# Patient Record
Sex: Female | Born: 2000 | Race: White | Hispanic: No | Marital: Single | Smoking: Current some day smoker
Health system: Southern US, Community
[De-identification: ages and names within clinical notes are randomized; demographics above are authoritative.]

## PROBLEM LIST (undated history)

## (undated) DIAGNOSIS — F419 Anxiety disorder, unspecified: Secondary | ICD-10-CM

---
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
## 2018-09-07 ENCOUNTER — Encounter (HOSPITAL_COMMUNITY): Payer: Self-pay

## 2018-09-07 ENCOUNTER — Emergency Department (HOSPITAL_COMMUNITY)
Admission: EM | Admit: 2018-09-07 | Discharge: 2018-09-08 | Disposition: A | Discharge status: Other Acute Inpatient Hospital | Payer: Medicaid Other | Attending: Emergency Medicine | Admitting: Emergency Medicine

## 2018-09-07 ENCOUNTER — Other Ambulatory Visit: Payer: Self-pay

## 2018-09-07 DIAGNOSIS — Z1159 Encounter for screening for other viral diseases: Secondary | ICD-10-CM | POA: Insufficient documentation

## 2018-09-07 DIAGNOSIS — G4700 Insomnia, unspecified: Secondary | ICD-10-CM | POA: Insufficient documentation

## 2018-09-07 DIAGNOSIS — F19959 Other psychoactive substance use, unspecified with psychoactive substance-induced psychotic disorder, unspecified: Secondary | ICD-10-CM

## 2018-09-07 DIAGNOSIS — R000 Tachycardia, unspecified: Secondary | ICD-10-CM | POA: Insufficient documentation

## 2018-09-07 DIAGNOSIS — F17210 Nicotine dependence, cigarettes, uncomplicated: Secondary | ICD-10-CM | POA: Insufficient documentation

## 2018-09-07 DIAGNOSIS — F1990 Other psychoactive substance use, unspecified, uncomplicated: Secondary | ICD-10-CM | POA: Insufficient documentation

## 2018-09-07 DIAGNOSIS — F29 Unspecified psychosis not due to a substance or known physiological condition: Secondary | ICD-10-CM | POA: Insufficient documentation

## 2018-09-07 HISTORY — DX: Anxiety disorder, unspecified: F41.9

## 2018-09-07 LAB — CBC WITH DIFFERENTIAL/PLATELET
Abs Immature Granulocytes: 0.02 10*3/uL (ref 0.00–0.07)
Basophils Absolute: 0.1 10*3/uL (ref 0.0–0.1)
Basophils Relative: 1 %
Eosinophils Absolute: 0.1 10*3/uL (ref 0.0–0.5)
Eosinophils Relative: 1 %
HCT: 41.6 % (ref 36.0–46.0)
Hemoglobin: 13.9 g/dL (ref 12.0–15.0)
Immature Granulocytes: 0 %
Lymphocytes Relative: 22 %
Lymphs Abs: 2.5 10*3/uL (ref 0.7–4.0)
MCH: 28.7 pg (ref 26.0–34.0)
MCHC: 33.4 g/dL (ref 30.0–36.0)
MCV: 85.8 fL (ref 80.0–100.0)
Monocytes Absolute: 0.8 10*3/uL (ref 0.1–1.0)
Monocytes Relative: 7 %
Neutro Abs: 7.8 10*3/uL — ABNORMAL HIGH (ref 1.7–7.7)
Neutrophils Relative %: 69 %
Platelets: 275 10*3/uL (ref 150–400)
RBC: 4.85 MIL/uL (ref 3.87–5.11)
RDW: 12.8 % (ref 11.5–15.5)
WBC: 11.3 10*3/uL — ABNORMAL HIGH (ref 4.0–10.5)
nRBC: 0 % (ref 0.0–0.2)

## 2018-09-07 LAB — ETHANOL: Alcohol, Ethyl (B): <10 mg/dL (ref <10)

## 2018-09-07 LAB — COMPREHENSIVE METABOLIC PANEL
ALT: 14 U/L (ref 0–44)
AST: 17 U/L (ref 15–41)
Albumin: 4.5 g/dL (ref 3.5–5.0)
Alkaline Phosphatase: 58 U/L (ref 38–126)
Anion gap: 15 (ref 5–15)
BUN: <5 mg/dL — ABNORMAL LOW (ref 6–20)
CO2: 22 mmol/L (ref 22–32)
Calcium: 9.9 mg/dL (ref 8.9–10.3)
Chloride: 102 mmol/L (ref 98–111)
Creatinine, Ser: 0.7 mg/dL (ref 0.44–1.00)
GFR calc Af Amer: >60 mL/min (ref >60)
GFR calc non Af Amer: >60 mL/min (ref >60)
Glucose, Bld: 122 mg/dL — ABNORMAL HIGH (ref 70–99)
Potassium: 3.3 mmol/L — ABNORMAL LOW (ref 3.5–5.1)
Sodium: 139 mmol/L (ref 135–145)
Total Bilirubin: 0.8 mg/dL (ref 0.3–1.2)
Total Protein: 7.7 g/dL (ref 6.5–8.1)

## 2018-09-07 LAB — ACETAMINOPHEN LEVEL: Acetaminophen (Tylenol), Serum: <10 ug/mL — ABNORMAL LOW (ref 10–30)

## 2018-09-07 LAB — PREGNANCY, URINE: Preg Test, Ur: NEGATIVE

## 2018-09-07 LAB — RAPID URINE DRUG SCREEN, HOSP PERFORMED
Amphetamines: NOT DETECTED
Barbiturates: NOT DETECTED
Benzodiazepines: POSITIVE — AB
Cocaine: NOT DETECTED
Opiates: NOT DETECTED
Tetrahydrocannabinol: POSITIVE — AB

## 2018-09-07 LAB — TSH: TSH: 1.5 u[IU]/mL (ref 0.350–4.500)

## 2018-09-07 LAB — SALICYLATE LEVEL: Salicylate Lvl: <7 mg/dL (ref 2.8–30.0)

## 2018-09-07 MED ORDER — ONDANSETRON HCL 4 MG PO TABS: 4.0000 mg | ORAL_TABLET | Freq: Three times a day (TID) | ORAL | Status: DC | PRN

## 2018-09-07 MED ORDER — DIPHENHYDRAMINE HCL 25 MG PO CAPS
50.0000 mg | ORAL_CAPSULE | Freq: Once | ORAL | Status: AC
  Administered 2018-09-07: 14:00:00 50 mg via ORAL
  Filled 2018-09-07: qty 2

## 2018-09-07 MED ORDER — ALUM & MAG HYDROXIDE-SIMETH 200-200-20 MG/5ML PO SUSP: 30.0000 mL | Freq: Four times a day (QID) | ORAL | Status: DC | PRN

## 2018-09-07 MED ORDER — CLONAZEPAM 0.5 MG PO TABS
0.5000 mg | ORAL_TABLET | Freq: Every day | ORAL | Status: DC
  Administered 2018-09-07: 12:00:00 0.5 mg via ORAL
  Filled 2018-09-07: qty 1

## 2018-09-07 MED ORDER — LORAZEPAM 2 MG/ML IJ SOLN
1.0000 mg | Freq: Four times a day (QID) | INTRAMUSCULAR | Status: DC | PRN
  Administered 2018-09-07: 17:00:00 1 mg via INTRAMUSCULAR
  Filled 2018-09-07: qty 1

## 2018-09-07 MED ORDER — OLANZAPINE 5 MG PO TABS: 5.0000 mg | ORAL_TABLET | Freq: Every day | ORAL | Status: DC

## 2018-09-07 MED ORDER — LORAZEPAM 1 MG PO TABS
1.0000 mg | ORAL_TABLET | Freq: Four times a day (QID) | ORAL | Status: DC | PRN
  Administered 2018-09-08: 18:00:00 1 mg via ORAL
  Filled 2018-09-07: qty 1

## 2018-09-07 MED ORDER — ACETAMINOPHEN 325 MG PO TABS: 650.0000 mg | ORAL_TABLET | ORAL | Status: DC | PRN

## 2018-09-07 MED ORDER — NICOTINE 21 MG/24HR TD PT24: 21.0000 mg | MEDICATED_PATCH | Freq: Every day | TRANSDERMAL | Status: DC

## 2018-09-07 MED ORDER — HALOPERIDOL LACTATE 5 MG/ML IJ SOLN
5.0000 mg | Freq: Once | INTRAMUSCULAR | Status: AC
  Administered 2018-09-07: 14:00:00 5 mg via INTRAVENOUS
  Filled 2018-09-07: qty 1

## 2018-09-07 MED ORDER — STERILE WATER FOR INJECTION IJ SOLN
INTRAMUSCULAR | Status: AC
  Administered 2018-09-07: 1.2 mL
  Filled 2018-09-07: qty 10

## 2018-09-07 MED ORDER — OLANZAPINE 5 MG PO TABS
5.0000 mg | ORAL_TABLET | Freq: Once | ORAL | Status: AC
  Administered 2018-09-07: 5 mg via ORAL
  Filled 2018-09-07: qty 1

## 2018-09-07 MED ORDER — HYDROXYZINE HCL 25 MG PO TABS
25.0000 mg | ORAL_TABLET | Freq: Once | ORAL | Status: AC
  Administered 2018-09-07: 09:00:00 25 mg via ORAL
  Filled 2018-09-07: qty 1

## 2018-09-07 MED ORDER — ZIPRASIDONE MESYLATE 20 MG IM SOLR
20.0000 mg | Freq: Two times a day (BID) | INTRAMUSCULAR | Status: DC | PRN
  Administered 2018-09-07: 16:00:00 20 mg via INTRAMUSCULAR
  Filled 2018-09-07: qty 20

## 2018-09-07 MED ORDER — ZOLPIDEM TARTRATE 5 MG PO TABS: 5.0000 mg | ORAL_TABLET | Freq: Every evening | ORAL | Status: DC | PRN

## 2018-09-07 NOTE — ED Triage Notes (Signed)
Pt from home with family for manic behavior pt states she has not slept for 4-5days and feels like she is in a fog and is out of her body.

## 2018-09-07 NOTE — ED Notes (Addendum)
While pt is eating lunch, she stated "No, no, no. Stop doing that. That annoys me!" Also attempting to remove pants - stating "I'm going to change into some shorts". Advised pt she is to leave on pants. Pt sat back down on bed. Pt then ambulated toward nurses' desk stating she needs to leave -stating "I have a lot to do today". Advised pt she will remain in ED - pt then returned to room.

## 2018-09-07 NOTE — Consult Note (Signed)
Telepsych Consultation   Reason for Consult:  Acute psychosis Referring Physician:  EDP Location of Patient: [facility] Location of Provider: [facility]  Patient Identification: Breanna Sanchez MRN:  [id number] Principal Diagnosis: Psychoactive substance-induced psychosis ([place]) Diagnosis:  Principal Problem:   Psychoactive substance-induced psychosis ([place])   Total Time spent with patient: 30 minutes  Subjective:   Breanna Sanchez is a 18 y.o. female patient admitted with psychotic symptoms.   Per initial [facility] Assessment note on 09/07/2018:  HPI:  Breanna Sanchez is an 18 y.o. female that presents this date with passive S/I and active AVH. Patient denies any H/I although admits to ongoing SA use with patient providing limited history and is observed to be very tangential displaying active flight of ideas. Patient is  anxious disorganized and difficult to redirect. Patient appears to be actively impaired and will not stay in her bed as this writer attempts to assess. Patient's UDS is positive for Benzodiazepines and THC. Patient provides limited history in reference to ongoing use stating she has been using "xanax, weed and other stuff." Patient cannot recall last use, substances used and time frames. Patient reports she has been living with her mother Breanna Sanchez [phone] until three weeks ago when she left that residence to "party with friends." Patient renders limited information in reference to where she has been with memory being recently impaired. Patient reports some passive S/I although will not answer any questions associated with self harm although denies any previous attempts or gestures. Patient stated she briefly saw a psychiatric provider in 2018 although cannot recall where that provider was or what she was seen for. Patient denies any prior inpatient hospitalizations or ongoing OP care. Patient states she recently met with "someone at [facility]"  who prescribed her Vistaril for anxiety although does not recall when that was. Patient states she discontinued that medication after two days. Patient reports she recently returned to her mother's residence after being gone for three weeks and since she has returned three days ago has not slept more that two hours. Patient is oriented x 4 and speaks in a loud pressured voice. Patient states she has recently acquired criminal charges that she believes are associated with drug charges although is uncertain what those charges are or when her court date is. Patient is very disorganized and rapidly moves from one unrelated topic to another. Patient reports ongoing AVH since she returned home to include "hearing and seeing things at night that are going to get her." Patient will not elaborate on content. Patient's mother could not be reached for collateral at this time. Per notes, Patient's family reports increased aggression and patient was delusional on arrival stating "her mother paid for them to stay one night, the police would be coming to pick her up tonight, and she would return home tomorrow". States she was brought to ED by her mother since she has not been able to sleep. Pt demonstrated how she has been sitting up in her bed staring at Probation officer. Pt asking for "spray". Pt noted to be standing up in room looking around - states "I am OK". Then states "I thought there were blinds in here". Pt then laid back on bed, smiling, stating "I'm OK".   Per psychiatric eval:  The patient was not able engage meaningfully in the assessment. She was noted to be speaking to people who were clearly not present in the room. Breanna Sanchez would not respond directly to questions aimed to further  clarify her recent substance use. She was observed walking around the room and going off camera for unclear reasons. Patient has not been able to elaborate on her recent drug abuse but drug induced psychosis is suspected.    Past Psychiatric  History: Substance abuse   Risk to Self: Suicidal Ideation: Yes-Currently Present Suicidal Intent: No Is patient at risk for suicide?: No Suicidal Plan?: No Access to Means: No What has been your use of drugs/alcohol within the last 12 months?: Current use How many times?: 0 Other Self Harm Risks: (Excessive SA use) Triggers for Past Attempts: (NA) Intentional Self Injurious Behavior: None Risk to Others: Homicidal Ideation: No Thoughts of Harm to Others: No Current Homicidal Intent: No Current Homicidal Plan: No Access to Homicidal Means: No Identified Victim: NA History of harm to others?: Yes Assessment of Violence: On admission Violent Behavior Description: Threats to family Does patient have access to weapons?: Yes (Comment)(Knives per family) Criminal Charges Pending?: Yes Describe Pending Criminal Charges: (UTA) Does patient have a court date: (UTA) Prior Inpatient Therapy: Prior Inpatient Therapy: No Prior Outpatient Therapy: Prior Outpatient Therapy: Yes Prior Therapy Dates: 2018 Prior Therapy Facilty/Provider(s): Pt cannot recall Reason for Treatment: Med mang Does patient have an ACCT team?: No Does patient have Intensive In-House Services?  : No Does patient have [facility] services? : No Does patient have P4CC services?: No  Past Medical History:  Past Medical History:  Diagnosis Date  . Anxiety    History reviewed. No pertinent surgical history. Family History: History reviewed. No pertinent family history. Family Psychiatric  History: Unknown Social History:  Social History   Substance and Sexual Activity  Alcohol Use Yes   Comment: occasional     Social History   Substance and Sexual Activity  Drug Use No    Social History   Socioeconomic History  . Marital status: Single    Spouse name: Not on file  . Number of children: Not on file  . Years of education: Not on file  . Highest education level: Not on file  Occupational History  . Not on  file  Social Needs  . Financial resource strain: Not on file  . Food insecurity:    Worry: Not on file    Inability: Not on file  . Transportation needs:    Medical: Not on file    Non-medical: Not on file  Tobacco Use  . Smoking status: Current Some Day Smoker    Types: Cigarettes  . Smokeless tobacco: Never Used  Substance and Sexual Activity  . Alcohol use: Yes    Comment: occasional  . Drug use: No  . Sexual activity: Not on file  Lifestyle  . Physical activity:    Days per week: Not on file    Minutes per session: Not on file  . Stress: Not on file  Relationships  . Social connections:    Talks on phone: Not on file    Gets together: Not on file    Attends religious service: Not on file    Active member of club or organization: Not on file    Attends meetings of clubs or organizations: Not on file    Relationship status: Not on file  Other Topics Concern  . Not on file  Social History Narrative  . Not on file   Additional Social History:    Allergies:  No Known Allergies  Labs:  Results for orders placed or performed during the hospital encounter of 09/07/18 (from the  past 48 hour(s))  Urine rapid drug screen (hosp performed)     Status: Abnormal   Collection Time: 09/07/18  7:20 AM  Result Value Ref Range   Opiates NONE DETECTED NONE DETECTED   Cocaine NONE DETECTED NONE DETECTED   Benzodiazepines POSITIVE (A) NONE DETECTED   Amphetamines NONE DETECTED NONE DETECTED   Tetrahydrocannabinol POSITIVE (A) NONE DETECTED   Barbiturates NONE DETECTED NONE DETECTED    Comment: (NOTE) DRUG SCREEN FOR MEDICAL PURPOSES ONLY.  IF CONFIRMATION IS NEEDED FOR ANY PURPOSE, NOTIFY LAB WITHIN 5 DAYS. LOWEST DETECTABLE LIMITS FOR URINE DRUG SCREEN Drug Class                     Cutoff (ng/mL) Amphetamine and metabolites    1000 Barbiturate and metabolites    200 Benzodiazepine                 616 Tricyclics and metabolites     300 Opiates and metabolites         300 Cocaine and metabolites        300 THC                            50 Performed at [facility], [place] [address] [place], [place] [address]   Pregnancy, urine     Status: None   Collection Time: 09/07/18  7:20 AM  Result Value Ref Range   Preg Test, Ur NEGATIVE NEGATIVE    Comment:        THE SENSITIVITY OF THIS METHODOLOGY IS >20 mIU/mL. Performed at [facility], [place] [address], [place],  [address]   Comprehensive metabolic panel     Status: Abnormal   Collection Time: 09/07/18  7:45 AM  Result Value Ref Range   Sodium 139 135 - 145 mmol/L   Potassium 3.3 (L) 3.5 - 5.1 mmol/L   Chloride 102 98 - 111 mmol/L   CO2 22 22 - 32 mmol/L   Glucose, Bld 122 (H) 70 - 99 mg/dL   BUN <5 (L) 6 - 20 mg/dL   Creatinine, Ser 0.70 0.44 - 1.00 mg/dL   Calcium 9.9 8.9 - 10.3 mg/dL   Total Protein 7.7 6.5 - 8.1 g/dL   Albumin 4.5 3.5 - 5.0 g/dL   AST 17 15 - 41 U/L   ALT 14 0 - 44 U/L   Alkaline Phosphatase 58 38 - 126 U/L   Total Bilirubin 0.8 0.3 - 1.2 mg/dL   GFR calc non Af Amer >60 >60 mL/min   GFR calc Af Amer >60 >60 mL/min   Anion gap 15 5 - 15    Comment: Performed at [facility], [place] [address] [place],  [address]  CBC with Differential     Status: Abnormal   Collection Time: 09/07/18  7:45 AM  Result Value Ref Range   WBC 11.3 (H) 4.0 - 10.5 K/uL   RBC 4.85 3.87 - 5.11 MIL/uL   Hemoglobin 13.9 12.0 - 15.0 g/dL   HCT 41.6 36.0 - 46.0 %   MCV 85.8 80.0 - 100.0 fL   MCH 28.7 26.0 - 34.0 pg   MCHC 33.4 30.0 - 36.0 g/dL   RDW 12.8 11.5 - 15.5 %   Platelets 275 150 - 400 K/uL   nRBC 0.0 0.0 - 0.2 %   Neutrophils Relative % 69 %   Neutro Abs 7.8 (H) 1.7 -  7.7 K/uL   Lymphocytes Relative 22 %   Lymphs Abs 2.5 0.7 - 4.0 K/uL   Monocytes Relative 7 %   Monocytes Absolute 0.8 0.1 - 1.0 K/uL   Eosinophils Relative 1 %   Eosinophils Absolute 0.1 0.0 - 0.5 K/uL   Basophils Relative 1 %   Basophils Absolute 0.1 0.0 - 0.1 K/uL   Immature  Granulocytes 0 %   Abs Immature Granulocytes 0.02 0.00 - 0.07 K/uL    Comment: Performed at [facility] [address], [place], [place] [address]  Acetaminophen level     Status: Abnormal   Collection Time: 09/07/18  7:45 AM  Result Value Ref Range   Acetaminophen (Tylenol), Serum <10 (L) 10 - 30 ug/mL    Comment: (NOTE) Therapeutic concentrations vary significantly. A range of 10-30 ug/mL  may be an effective concentration for many patients. However, some  are best treated at concentrations outside of this range. Acetaminophen concentrations >150 ug/mL at 4 hours after ingestion  and >50 ug/mL at 12 hours after ingestion are often associated with  toxic reactions. Performed at [facility], [place] [address] [place], [place] [address]   Salicylate level     Status: None   Collection Time: 09/07/18  7:45 AM  Result Value Ref Range   Salicylate Lvl <0.9 2.8 - 30.0 mg/dL    Comment: Performed at [facility] [address] [place], [place] [address]  Ethanol     Status: None   Collection Time: 09/07/18  7:45 AM  Result Value Ref Range   Alcohol, Ethyl (B) <10 <10 mg/dL    Comment: (NOTE) Lowest detectable limit for serum alcohol is 10 mg/dL. For medical purposes only. Performed at [facility], [place] [address], [place], [place] [address]   TSH     Status: None   Collection Time: 09/07/18  7:45 AM  Result Value Ref Range   TSH 1.500 0.350 - 4.500 uIU/mL    Comment: Performed by a 3rd Generation assay with a functional sensitivity of <=0.01 uIU/mL. Performed at [facility], [place] [address], [place], [place] [address]     Medications:  Current Facility-Administered Medications  Medication Dose Route Frequency Provider Last Rate Last Dose  . acetaminophen (TYLENOL) tablet 650 mg  650 mg Oral Q4H PRN Domenic Moras, PA-C      . alum & mag hydroxide-simeth (MAALOX/MYLANTA) 200-200-20 MG/5ML suspension 30 mL  30 mL Oral Q6H PRN Domenic Moras, PA-C       . LORazepam (ATIVAN) tablet 1 mg  1 mg Oral Q6H PRN Niel Hummer, NP       Or  . LORazepam (ATIVAN) injection 1 mg  1 mg Intramuscular Q6H PRN Elmarie Shiley A, NP      . nicotine (NICODERM CQ - dosed in mg/24 hours) patch 21 mg  21 mg Transdermal Daily Domenic Moras, PA-C      . OLANZapine (ZYPREXA) tablet 5 mg  5 mg Oral QHS Domenic Moras, PA-C      . ondansetron [facility]) tablet 4 mg  4 mg Oral Q8H PRN Domenic Moras, PA-C      . ziprasidone (GEODON) injection 20 mg  20 mg Intramuscular Q12H PRN Elmarie Shiley A, NP   20 mg at 09/07/18 1618  . zolpidem (AMBIEN) tablet 5 mg  5 mg Oral QHS PRN Domenic Moras, PA-C       Current Outpatient Medications  Medication Sig Dispense Refill  .  clonazePAM (KLONOPIN) 0.5 MG tablet Take 0.5 mg by mouth daily.    . hydrOXYzine (ATARAX/VISTARIL) 50 MG tablet Take 50 mg by mouth daily.     . traZODone (DESYREL) 50 MG tablet Take 50 mg by mouth at bedtime.       Musculoskeletal:  Unable to assess via camera   Psychiatric Specialty Exam: Physical Exam  Psychiatric: Her mood appears anxious. Her affect is labile. Her speech is tangential. She is agitated and actively hallucinating. Thought content is delusional. Cognition and memory are impaired. She expresses impulsivity.    ROS  Blood pressure 130/81, pulse (!) 110, temperature 98.4 F (36.9 C), temperature source Oral, resp. rate 14, height _0  (1.651 m), weight 64.9 kg, SpO2 98 %.Body mass index is 23.8 kg/m.  General Appearance: Disheveled  Eye Contact:  Minimal  Speech:  Garbled  Volume:  Normal  Mood:  Euphoric  Affect:  Labile  Thought Process:  Disorganized  Orientation:  Other:  Unable to assess  Thought Content:  Hallucinations: Visual  Suicidal Thoughts:  Unable to assess  Homicidal Thoughts:  Unable to assess  Memory:  Immediate;   Poor Recent;   Poor Remote;   Poor  Judgement:  Poor  Insight:  Lacking  Psychomotor Activity:  Increased and Restlessness  Concentration:  Concentration:  Poor and Attention Span: Poor  Recall:  Poor  Fund of Knowledge:  Poor  Language:  Poor  Akathisia:  Negative  Handed:  Right  AIMS (if indicated):     Assets:  Housing Leisure Time Physical Health Resilience Social Support  ADL's:  Impaired  Cognition:  Impaired,  Moderate  Sleep:        Treatment Plan Summary: Plan Admit to psychiatric inpatient to stabilize her psychosis  Discussed medication recommendations with Dr. Mallie Darting. Have ordered Geodon 20 mg IM every twelve hours as needed for agitation. Also ativan 1 mg as needed po/IM for anxiety.   Disposition: Recommend psychiatric Inpatient admission when medically cleared.  This service was provided via telemedicine using a 2-way, interactive audio and video technology.  Names of all persons participating in this telemedicine service and their role in this encounter. Name: Breanna Sanchez Role: Patient  Name: Elmarie Shiley  Role: Psych NP  Name:  Role:   Name:  Role:     Elmarie Shiley, NP 09/07/2018 4:24 PM

## 2018-09-07 NOTE — ED Notes (Signed)
Telepsych being performed. Prior to Telepsych, pt continuing to attempt to undress. Pt had hand up to left ear as if she was holding a phone stating "Are the scales in the car? OK. We're on the [place] end of [place]. We'll be in [place] soon".

## 2018-09-07 NOTE — ED Notes (Signed)
Snacks given to pt.

## 2018-09-07 NOTE — ED Notes (Signed)
Pt attempted to remove her shirt - pulled left arm through shirt and attempted to remove over her head. Encouraged pt to put shirt back on - pt did as directed.

## 2018-09-07 NOTE — ED Notes (Addendum)
Pt noted to be agitated - states "There's my mom right there". When Sitter asked her about this, she stated "Just shut up to the [place]". Pt rolled up napkin - placed end in her nose and acting as if she was snorting possibly cocaine. Pt noted to be talking louder as well - appears to be responding to internal stimuli.

## 2018-09-07 NOTE — ED Notes (Signed)
Pt noted w/blanket in her hands and ran out of room into hallway. Pt was redirected to room. Pt ambulated to desk x 2 - stating "I live in [facility]" then stated "My mom has my ID so I can bring it to you in the morning and you can just make a quick copy". Pt noted to be manic.

## 2018-09-07 NOTE — ED Notes (Signed)
Pt noted to continue to be talking loudly to herself - appears to be responding to internal stimuli - and restless. Ativan given. Continuous Pulse Ox applied - Sats 97-98% on room air.

## 2018-09-07 NOTE — ED Notes (Signed)
Pt noted to be pacing in room during Telepsych.

## 2018-09-07 NOTE — ED Notes (Signed)
Pt lying on bed watching tv - overheard Security radio stating an elevator button was pressed calling for assistance and they were not able to hear anyone talk and did not see anything on camera. Pt stated "Yes, they can. If they will look on the camera, they will see that someone pushed the button".

## 2018-09-07 NOTE — ED Notes (Signed)
Pt lying on abd when VS's taken.

## 2018-09-07 NOTE — ED Notes (Signed)
Pt's mother Parthenia Ames - [phone] - called to inquire about pt. Advised pt is resting quietly at this time. Mother voiced relief. Stating pt has been home x 6 days and not sleeping - attempting to run out into street. States she had taken pt to [facility] who gave pt Ativan then discharged her so she brought pt to [facility]. Requested for pt not to be able to use the phone to make multiple phone calls as pt is manic. Advised her of 2-phone call use policy - voiced appreciation. Requested to be notified of any changes. Also advised may call her for any need for pt.

## 2018-09-07 NOTE — ED Notes (Signed)
IVC papers served - Copy faxed to [facility] - Copy sent to Medical Records - Original placed in folder for Magistrate - All 3 sets on clipboard.  

## 2018-09-07 NOTE — BH Assessment (Signed)
[facility] Assessment Progress Note Case was staffed with Earlene Plater FNP who recommended patient be observed and monitored for safety.

## 2018-09-07 NOTE — ED Notes (Addendum)
Pt continues to appear delusional - refusing to sit on bed d/t states "there is Xanax powder on that bed". RN, Comptroller, and Off-Duty [employer] Officer assured pt there is nothing on the bed. Pt laid back down after attempting to ambulate to hallway and continues w/delusional talking.

## 2018-09-07 NOTE — ED Notes (Signed)
Lunch tray ordered 

## 2018-09-07 NOTE — ED Notes (Addendum)
Pt removed pants - put back on after much encouragement from RN. Pt had removed pillow case and was unable to put back on pillow. RN put back on for pt - encouraged pt to rest/relax. Pt noted to continue to be responding to [facility].

## 2018-09-07 NOTE — ED Notes (Signed)
Pt asking where the "2 little children that were lying on the bed" went.
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
## 2018-09-07 NOTE — ED Notes (Addendum)
Breanna Sanchez, [facility] NP, advised consulted w/psychiatrist and placed order for meds. Pt tolerated injection well. Pt then sat in floor in room - after much encouragement from staff, staff sitting on bed. Warm blanket given. Pt reading Medical Clearance Pt Policy to her mother who she is hallucinating is present. Pt not reading what is on paper. Pt is talking delusional.

## 2018-09-07 NOTE — ED Notes (Addendum)
Pt tolerated injection well. Pt asking [employer] Officer if he is pregnant. States "I thought you told me you didn't want to get pregnant but now you are". Pt noted to be restless.

## 2018-09-07 NOTE — ED Notes (Signed)
ALL belongings inventoried - 1 labeled orange bag placed in [place] [address] and Valuables Envelope w/pt's cell phone, charger, and block - Security.

## 2018-09-07 NOTE — ED Notes (Signed)
When pt arrived to ED pt and aunt were in pt drop off fighting and yelling. Mother, grandmother and aunt stated that pt left home for 3 weeks and when she came back she admitted to doing weed , xanax laced with fentanyl and heroin. Family also stated that pt has become much for aggressive trying to fight and pulling out knifes. Family stated that pt has been "seeing and hearing things". Family expressed that they are terrified for her to be home. RN informed.

## 2018-09-07 NOTE — ED Notes (Addendum)
Per Vernona Rieger [facility] NP, recommends to start pt on Zyprexa Zydis 5mg  po now and every QHS. Recommends to restart Klonopin 0.5mg  daily prn and Atarax 25mg  every 6 hours prn d/t pt's manic and delusional behavior. Archie Endo, PA, aware. IVC paperwork in process.

## 2018-09-07 NOTE — ED Notes (Addendum)
Pt arrived to Rm [facility] via w/c. Pt noted to be wearing burgundy scrubs - alert, oriented. Pt noted to be delusional - states her mother paid for them to stay one night, the police would be coming to pick her up tonight, and she would return tomorrow. States she was brought to ED by her mother and aunt d/t states she has not been able to sleep. Pt demonstrated how she has been sitting up in her bed staring d/t unable to sleep. Pt asking for "spray". Pt noted to be standing up in room looking around - states "I am OK". Then states "I thought there were blinds in here". Pt then laid back on bed, smiling, stating "I'm OK". Pt had verbalized understanding of Medical Clearance Pt Policy and signed form. Pt now asking for her phone. When Sitter advised no personal belongings allowed and showed pt the paper she had read and signed, pt stated "Oh, I didn't see this". Pt's personal orange back placed at nurses' desk for Inventory.

## 2018-09-07 NOTE — BH Assessment (Signed)
Assessment Note  Breanna Sanchez is an 18 y.o. female that presents this date with passive S/I and active AVH. Patient denies any H/I although admits to ongoing SA use with patient providing limited history and is observed to be very tangential displaying active flight of ideas. Patient is  anxious disorganized and difficult to redirect. Patient appears to be actively impaired and will not stay in her bed as this writer attempts to assess. Patient's UDS is positive for Benzodiazepines and THC. Patient provides limited history in reference to ongoing use stating she has been using "xanax, weed and other stuff." Patient cannot recall last use, substances used and time frames. Patient reports she has been living with her mother Breanna Sanchez [phone] until three weeks ago when she left that residence to "party with friends." Patient renders limited information in reference to where she has been with memory being recently impaired. Patient reports some passive S/I although will not answer any questions associated with self harm although denies any previous attempts or gestures. Patient stated she briefly saw a psychiatric provider in 2018 although cannot recall where that provider was or what she was seen for. Patient denies any prior inpatient hospitalizations or ongoing OP care. Patient states she recently met with "someone at [facility]" who prescribed her Vistaril for anxiety although does not recall when that was. Patient states she discontinued that medication after two days. Patient reports she recently returned to her mother's residence after being gone for three weeks and since she has returned three days ago has not slept more that two hours. Patient is oriented x 4 and speaks in a loud pressured voice. Patient states she has recently acquired criminal charges that she believes are associated with drug charges although is uncertain what those charges are or when her court date is. Patient is  very disorganized and rapidly moves from one unrelated topic to another. Patient reports ongoing AVH since she returned home to include "hearing and seeing things at night that are going to get her." Patient will not elaborate on content. Patient's mother could not be reached for collateral at this time. Per notes, Patient's family reports increased aggression and patient was delusional on arrival stating "her mother paid for them to stay one night, the police would be coming to pick her up tonight, and she would return home tomorrow". States she was brought to ED by her mother since she has not been able to sleep. Pt demonstrated how she has been sitting up in her bed staring at Probation officer. Pt asking for "spray". Pt noted to be standing up in room looking around - states "I am OK". Then states "I thought there were blinds in here". Pt then laid back on bed, smiling, stating "I'm OK". Case was staffed with Rosana Hoes FNP who recommended patient be observed and monitored for safety.   Diagnosis: Substance induced mood disorder.   Past Medical History:  Past Medical History:  Diagnosis Date  . Anxiety     History reviewed. No pertinent surgical history.  Family History: History reviewed. No pertinent family history.  Social History:  reports that she has been smoking cigarettes. She has never used smokeless tobacco. She reports current alcohol use. She reports that she does not use drugs.  Additional Social History:  Alcohol / Drug Use Pain Medications: See MAR Prescriptions: See MAR Over the Counter: See MAR History of alcohol / drug use?: Yes Longest period of sobriety (when/how long): Unk Negative Consequences of Use: (Denies) Withdrawal  Symptoms: (Denies) Substance #1 Name of Substance 1: Cannabis  1 - Age of First Use: 17 1 - Amount (size/oz): 1 gram 1 - Frequency: Varies 1 - Duration: Ongoing 1 - Last Use / Amount: 09/05/18 1 gram Substance #2 Name of Substance 2: Xanax 2 - Age of First  Use: 17 2 - Amount (size/oz): Varies 2 - Frequency: Varies 2 - Duration: Ongoing 2 - Last Use / Amount: 09/05/18 Unknown amount Substance #3 Name of Substance 3: Alcohol  3 - Age of First Use: 15 3 - Amount (size/oz): Varies 3 - Frequency: Varies 3 - Duration: Ongoing 3 - Last Use / Amount: Pt cannot recall  CIWA: CIWA-Ar BP: (!) 145/100 Pulse Rate: (!) 120 COWS:    Allergies: No Known Allergies  Home Medications: (Not in a hospital admission)   OB/GYN Status:  No LMP recorded. Patient has had an implant.  General Assessment Data Location of Assessment: [facility] ED TTS Assessment: In system Is this a Tele or Face-to-Face Assessment?: Tele Assessment Is this an Initial Assessment or a Re-assessment for this encounter?: Initial Assessment Patient Accompanied by:: (NA) Language Other than English: No Living Arrangements: Other (Comment)(Parent) What gender do you identify as?: Female Marital status: Single Pregnancy Status: Unknown Living Arrangements: Parent Can pt return to current living arrangement?: Yes Admission Status: Voluntary Is patient capable of signing voluntary admission?: Yes Referral Source: Self/Family/Friend Insurance type: Medicaid  Medical Screening Exam ([place]) Medical Exam completed: Yes  Crisis Care Plan Living Arrangements: Parent Legal Guardian: (NA) Name of Psychiatrist: None Name of Therapist: None  Education Status Is patient currently in school?: No Is the patient employed, unemployed or receiving disability?: Unemployed  Risk to self with the past 6 months Suicidal Ideation: Yes-Currently Present Has patient been a risk to self within the past 6 months prior to admission? : No Suicidal Intent: No Has patient had any suicidal intent within the past 6 months prior to admission? : No Is patient at risk for suicide?: No Suicidal Plan?: No Has patient had any suicidal plan within the past 6 months prior to admission? :  No Access to Means: No What has been your use of drugs/alcohol within the last 12 months?: Current use Previous Attempts/Gestures: No How many times?: 0 Other Self Harm Risks: (Excessive SA use) Triggers for Past Attempts: (NA) Intentional Self Injurious Behavior: None Family Suicide History: No Recent stressful life event(s): Conflict (Comment)(Conflict with family members) Persecutory voices/beliefs?: No Depression: No Depression Symptoms: (Denies) Substance abuse history and/or treatment for substance abuse?: No Suicide prevention information given to non-admitted patients: Not applicable  Risk to Others within the past 6 months Homicidal Ideation: No Does patient have any lifetime risk of violence toward others beyond the six months prior to admission? : No Thoughts of Harm to Others: No Current Homicidal Intent: No Current Homicidal Plan: No Access to Homicidal Means: No Identified Victim: NA History of harm to others?: Yes Assessment of Violence: On admission Violent Behavior Description: Threats to family Does patient have access to weapons?: Yes (Comment)(Knives per family) Criminal Charges Pending?: Yes Describe Pending Criminal Charges: (UTA) Does patient have a court date: (UTA) Is patient on probation?: No  Psychosis Hallucinations: Auditory, Visual Delusions: None noted  Mental Status Report Appearance/Hygiene: In scrubs Eye Contact: Fair Motor Activity: Agitation, Hyperactivity Speech: Rapid, Pressured Level of Consciousness: Restless Mood: Anxious Affect: Silly Anxiety Level: Moderate Thought Processes: Flight of Ideas Judgement: Impaired Orientation: Person, Place, Time Obsessive Compulsive Thoughts/Behaviors: None  Cognitive  Functioning Concentration: Decreased Memory: Recent Intact, Remote Intact Is patient IDD: No  ADLScreening [facility] Assessment Services) Patient's cognitive ability adequate to safely complete daily activities?: Yes Patient  able to express need for assistance with ADLs?: Yes Independently performs ADLs?: Yes (appropriate for developmental age)  Prior Inpatient Therapy Prior Inpatient Therapy: No  Prior Outpatient Therapy Prior Outpatient Therapy: Yes Prior Therapy Dates: 2018 Prior Therapy Facilty/Provider(s): Pt cannot recall Reason for Treatment: Med mang Does patient have an ACCT team?: No Does patient have Intensive In-House Services?  : No Does patient have [facility] services? : No Does patient have P4CC services?: No  ADL Screening (condition at time of admission) Patient's cognitive ability adequate to safely complete daily activities?: Yes Is the patient deaf or have difficulty hearing?: No Does the patient have difficulty seeing, even when wearing glasses/contacts?: No Does the patient have difficulty concentrating, remembering, or making decisions?: No Patient able to express need for assistance with ADLs?: Yes Does the patient have difficulty dressing or bathing?: No Independently performs ADLs?: Yes (appropriate for developmental age) Does the patient have difficulty walking or climbing stairs?: No Weakness of Legs: None Weakness of Arms/Hands: None  Home Assistive Devices/Equipment Home Assistive Devices/Equipment: None  Therapy Consults (therapy consults require a physician order) PT Evaluation Needed: No OT Evalulation Needed: No SLP Evaluation Needed: No Abuse/Neglect Assessment (Assessment to be complete while patient is alone) Physical Abuse: Denies Verbal Abuse: Denies Sexual Abuse: Denies Exploitation of patient/patient's resources: Denies Self-Neglect: Denies Values / Beliefs Cultural Requests During Hospitalization: None Spiritual Requests During Hospitalization: None Consults Spiritual Care Consult Needed: No Social Work Consult Needed: No Regulatory affairs officer (For Healthcare) Does Patient Have a Medical Advance Directive?: No Would patient like information on creating  a medical advance directive?: No - Patient declined          Disposition: Case was staffed with Rosana Hoes FNP who recommended patient be observed and monitored for safety.   Disposition Initial Assessment Completed for this Encounter: Yes  On Site Evaluation by:   Reviewed with Physician:    Mamie Nick 09/07/2018 10:07 AM

## 2018-09-07 NOTE — ED Notes (Addendum)
Pt belongings in locker #1 in purple zone

## 2018-09-07 NOTE — ED Notes (Signed)
Filled out IVC paper work, faxed to [employer] and called for service of paper work.

## 2018-09-07 NOTE — ED Notes (Addendum)
Pt took meds w/o difficulty. Pt noted to be restless, manic, continues to attempt to ambulate in hallway - pt is re-directable at this time. Pt also noted to be talking out loud intermittently and when staff asks pt if she is OK or if she needs something, pt smiles and states "No, I'm OK".

## 2018-09-08 ENCOUNTER — Inpatient Hospital Stay (HOSPITAL_COMMUNITY)
Admission: AD | Admit: 2018-09-08 | Discharge: 2018-09-10 | DRG: 897 | Disposition: A | Discharge status: Home / Self Care | Payer: Medicaid Other | Attending: Psychiatry | Admitting: Psychiatry

## 2018-09-08 ENCOUNTER — Encounter (HOSPITAL_COMMUNITY): Payer: Self-pay | Admitting: *Deleted

## 2018-09-08 ENCOUNTER — Other Ambulatory Visit: Payer: Self-pay

## 2018-09-08 DIAGNOSIS — Z1159 Encounter for screening for other viral diseases: Secondary | ICD-10-CM

## 2018-09-08 DIAGNOSIS — F19959 Other psychoactive substance use, unspecified with psychoactive substance-induced psychotic disorder, unspecified: Secondary | ICD-10-CM | POA: Diagnosis not present

## 2018-09-08 DIAGNOSIS — F12259 Cannabis dependence with psychotic disorder, unspecified: Secondary | ICD-10-CM | POA: Diagnosis present

## 2018-09-08 DIAGNOSIS — F17210 Nicotine dependence, cigarettes, uncomplicated: Secondary | ICD-10-CM | POA: Diagnosis present

## 2018-09-08 DIAGNOSIS — G4700 Insomnia, unspecified: Secondary | ICD-10-CM | POA: Diagnosis present

## 2018-09-08 LAB — SARS CORONAVIRUS 2 BY RT PCR (HOSPITAL ORDER, PERFORMED IN ~~LOC~~ HOSPITAL LAB): SARS Coronavirus 2: NEGATIVE

## 2018-09-08 MED ORDER — NICOTINE POLACRILEX 2 MG MT GUM: 2.0000 mg | CHEWING_GUM | OROMUCOSAL | Status: DC | PRN

## 2018-09-08 MED ORDER — MAGNESIUM HYDROXIDE 400 MG/5ML PO SUSP: 30.0000 mL | Freq: Every day | ORAL | Status: DC | PRN

## 2018-09-08 MED ORDER — ONDANSETRON HCL 4 MG PO TABS: 4.0000 mg | ORAL_TABLET | Freq: Three times a day (TID) | ORAL | Status: DC | PRN

## 2018-09-08 MED ORDER — LORAZEPAM 1 MG PO TABS
1.0000 mg | ORAL_TABLET | Freq: Four times a day (QID) | ORAL | Status: DC | PRN
  Administered 2018-09-08: 1 mg via ORAL
  Filled 2018-09-08: qty 1

## 2018-09-08 MED ORDER — ALUM & MAG HYDROXIDE-SIMETH 200-200-20 MG/5ML PO SUSP: 30.0000 mL | ORAL | Status: DC | PRN

## 2018-09-08 MED ORDER — OLANZAPINE 5 MG PO TABS
5.0000 mg | ORAL_TABLET | Freq: Every day | ORAL | Status: DC
  Administered 2018-09-08 – 2018-09-09 (×2): 5 mg via ORAL
  Filled 2018-09-08: qty 2
  Filled 2018-09-08 (×3): qty 1

## 2018-09-08 MED ORDER — ACETAMINOPHEN 325 MG PO TABS: 650.0000 mg | ORAL_TABLET | Freq: Four times a day (QID) | ORAL | Status: DC | PRN

## 2018-09-08 MED ORDER — LORAZEPAM 2 MG/ML IJ SOLN: 1.0000 mg | Freq: Four times a day (QID) | INTRAMUSCULAR | Status: DC | PRN

## 2018-09-08 MED ORDER — NICOTINE 21 MG/24HR TD PT24
21.0000 mg | MEDICATED_PATCH | Freq: Every day | TRANSDERMAL | Status: DC
  Filled 2018-09-08 (×3): qty 1

## 2018-09-08 MED ORDER — ENSURE ENLIVE PO LIQD: 237.0000 mL | Freq: Two times a day (BID) | ORAL | Status: DC

## 2018-09-08 MED ORDER — PNEUMOCOCCAL VAC POLYVALENT 25 MCG/0.5ML IJ INJ: 0.5000 mL | INJECTION | INTRAMUSCULAR | Status: DC

## 2018-09-08 MED ORDER — TRAZODONE HCL 50 MG PO TABS: 50.0000 mg | ORAL_TABLET | Freq: Every evening | ORAL | Status: DC | PRN

## 2018-09-08 NOTE — ED Notes (Addendum)
Pt aware and voiced agreement w/tx plan - accepted to [facility] and will be transported there this evening. Pt on phone advising her mother of plan. Ativan given d/t states "I'm feeling a little anxious and I was wondering if my mom brought my Atarax". Pt continues to appear calm, cooperative, pleasant.

## 2018-09-08 NOTE — ED Notes (Signed)
[facility] [employer] called for transport

## 2018-09-08 NOTE — Tx Team (Signed)
Initial Treatment Plan 09/08/2018 10:59 PM Nayla Diponio YTK:[id number]    PATIENT STRESSORS: Financial difficulties Occupational concerns Other: "my boyfriend is in rehab for 4 months"   PATIENT STRENGTHS: Ability for insight Active sense of humor Average or above average intelligence Communication skills General fund of knowledge Physical Health Supportive family/friends   PATIENT IDENTIFIED PROBLEMS: "get better"  "get back  To becoming more myself"     Substance abuse  Psychosis prior to admission  SI per report           DISCHARGE CRITERIA:  Improved stabilization in mood, thinking, and/or behavior  PRELIMINARY DISCHARGE PLAN: Outpatient therapy  PATIENT/FAMILY INVOLVEMENT: This treatment plan has been presented to and reviewed with the patient, Fredonia Highland.  The patient and family have been given the opportunity to ask questions and make suggestions.  Arrie Aran, [place] 09/08/2018, 10:59 PM

## 2018-09-08 NOTE — ED Notes (Signed)
Pt ate a few bites of breakfast

## 2018-09-08 NOTE — ED Notes (Signed)
Pt has been resting on bed quietly thus far into this RN's shift. Stated felt much better when woke this am. Mother called and is aware, per Laban Emperor NP [facility], recommendation - Will continue to seek inpt placement and will reassess pt tomorrow if not placement has been found.

## 2018-09-08 NOTE — Progress Notes (Addendum)
Per  Kenney Houseman, pt has been accepted to [facility] bed 304-2. Accepting provider is L. Earlene Plater, NP. Attending provider is Dr. Jola Babinski, MD. Patient can arrive by 20:00. Number for report is [phone]. AC spoke with Kriste Basque, RN regarding disposition.   Vilma Meckel. Algis Greenhouse, MSW, LCSW Clinical Social Work/Disposition Phone: [phone] Fax: [phone]

## 2018-09-08 NOTE — BHH Counselor (Signed)
Pt is a 18 year old female who presented to the ED on 09/07/2018 in agitated state and altered mental status.  Pt was reassessed this  AM.  She stated as follows:  Pt stated that she finally slept last night after being awake for six days.  Pt stated that about six days ago, she was given what she believed to be a Xanax by another person.  After taking it, she felt agitated and could not sleep.  She believes now that the tablet was pressed meth.  Pt denied SI, HI, AVH.  She stated that she feels much better and would like to be discharged.

## 2018-09-08 NOTE — BHH Counselor (Signed)
Attempted reassessment.  Pt was sleeping..  Will call again.
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
## 2018-09-08 NOTE — ED Provider Notes (Signed)
Emergency Medicine Observation Re-evaluation Note  Breanna Sanchez is a 18 y.o. female, seen on rounds today.  Pt initially presented to the ED for complaints of Insomnia Currently, the patient has just finished breakfast and is sitting up in bed, without complaints. Per nursing, patient slept through the night and is alert and able to carry out meaningful conversation. Initially met inpatient criteria and pending placement, however w questionable illicit drug use, pt monitored overnight and will be re-evaluted by psychiatry this morning for disposition.  Physical Exam  BP 122/75 (BP Location: Right Arm)   Pulse 98   Temp 98.1 F (36.7 C) (Oral)   Resp 16   Ht '5\' 5"'  (1.651 m)   Wt 64.9 kg   SpO2 95%   BMI 23.80 kg/m  Physical Exam Vitals signs and nursing note reviewed.  Constitutional:      General: She is not in acute distress.    Appearance: She is well-developed.  HENT:     Head: Normocephalic and atraumatic.  Eyes:     Conjunctiva/sclera: Conjunctivae normal.  Cardiovascular:     Rate and Rhythm: Normal rate.  Pulmonary:     Effort: Pulmonary effort is normal.  Abdominal:     Palpations: Abdomen is soft.  Skin:    General: Skin is warm.  Neurological:     Mental Status: She is alert.  Psychiatric:        Behavior: Behavior normal.     ED Course / MDM  EKG:EKG Interpretation  Date/Time:  Saturday Sep 07 2018 07:39:01 EDT Ventricular Rate:  108 PR Interval:    QRS Duration: 86 QT Interval:  327 QTC Calculation: 439 R Axis:   99 Text Interpretation:  Sinus tachycardia Borderline right axis deviation Borderline repolarization abnormality No previous ECGs available Confirmed by Theotis Burrow [phone]) on 09/07/2018 9:16:48 AM    I have reviewed the labs performed to date as well as medications administered while in observation.  No Recent changes in the last 24. Plan  Current plan is for psychiatry re-evaluation for disposition, w questionable drug use being  contributory to pt's presentation. Patient is under full IVC at this time.   Charlynn Salih, [place] N, PA-C 09/08/18 [id number]    Margette Fast, MD 09/09/18 [phone]

## 2018-09-08 NOTE — ED Notes (Signed)
Breakfast try ordered 
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
## 2018-09-08 NOTE — ED Notes (Signed)
PA with pt at bedside.

## 2018-09-08 NOTE — ED Notes (Signed)
Ordered pt's lunch
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
## 2018-09-09 DIAGNOSIS — F19959 Other psychoactive substance use, unspecified with psychoactive substance-induced psychotic disorder, unspecified: Secondary | ICD-10-CM

## 2018-09-09 MED ORDER — PRENATAL MULTIVITAMIN CH
1.0000 | ORAL_TABLET | Freq: Every day | ORAL | Status: DC
  Administered 2018-09-09 – 2018-09-10 (×2): 1 via ORAL
  Filled 2018-09-09 (×4): qty 1

## 2018-09-09 MED ORDER — TEMAZEPAM 15 MG PO CAPS
15.0000 mg | ORAL_CAPSULE | Freq: Every day | ORAL | Status: DC
  Administered 2018-09-09: 15 mg via ORAL
  Filled 2018-09-09: qty 1

## 2018-09-09 MED ORDER — ENSURE ENLIVE PO LIQD
237.0000 mL | ORAL | Status: DC
  Administered 2018-09-09: 237 mL via ORAL

## 2018-09-09 MED ORDER — OMEGA-3-ACID ETHYL ESTERS 1 G PO CAPS
1.0000 g | ORAL_CAPSULE | Freq: Two times a day (BID) | ORAL | Status: DC
  Administered 2018-09-09 – 2018-09-10 (×3): 1 g via ORAL
  Filled 2018-09-09 (×6): qty 1

## 2018-09-09 MED ORDER — ADULT MULTIVITAMIN W/MINERALS CH
1.0000 | ORAL_TABLET | Freq: Every day | ORAL | Status: DC
  Filled 2018-09-09 (×2): qty 1
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
## 2018-09-09 NOTE — Progress Notes (Signed)
NUTRITION ASSESSMENT RD working remotely.   Pt identified as at risk on the Malnutrition Screen Tool  INTERVENTION: - continue Ensure Enlive but will decrease to once/day, each supplement provides 350 kcal and 20 grams of protein. - continue to encourage PO intakes.   NUTRITION DIAGNOSIS: Unintentional weight loss related to sub-optimal intake as evidenced by pt report.   Goal: Pt to meet >/= 90% of their estimated nutrition needs.  Monitor:  PO intake  Assessment:  Patient admitted for psychosis, paranoia, manic episode, and reports of abuse of benzos and marijuana. Patient reported that she has lost 20 lbs recently (unsure of more specific time frame) Per chart review, current weight is 141 lb and most recent weight PTA was on 08/03/16 at [facility] when she weighed 169 lb. This indicates 28 lb weight loss (16.5% body weight). Ensure Enlive was ordered BID per ONS protocol at the time of admission; has not yet been offered to patient. Will decrease to once/day to encourage intakes of meals and snacks.    18 y.o. female  Height: Ht Readings from Last 1 Encounters:  09/08/18 5\' 5"  (1.651 m)    Weight: Wt Readings from Last 1 Encounters:  09/08/18 64 kg    Weight Hx: Wt Readings from Last 10 Encounters:  09/08/18 64 kg  09/07/18 64.9 kg  01/25/16 74.8 kg (92 %, Z= 1.41)*   * Growth percentiles are based on CDC (Girls, 2-20 Years) data.    BMI:  Body mass index is 23.46 kg/m. Pt meets criteria for normal weight based on current BMI.  Estimated Nutritional Needs: Kcal: 25-30 kcal/kg Protein: > 1 gram protein/kg Fluid: 1 ml/kcal  Diet Order:  Diet Order            Diet regular Room service appropriate? Yes; Fluid consistency: Thin  Diet effective now             Pt is also offered choice of unit snacks mid-morning and mid-afternoon.  Pt is eating as desired.   Lab results and medications reviewed.     Trenton Gammon, MS, RD, LDN, [facility] Inpatient Clinical  Dietitian Pager # [phone] After hours/weekend pager # [phone]

## 2018-09-09 NOTE — Progress Notes (Signed)
D: Pt alert and oriented. Pt has a bright affect, is pleasant and cooperative. Pt denies pain. Pt denies experiencing any SI/HI, or AVH at this time.   A: Scheduled medications administered to pt, per MD orders. Support and encouragement provided. Frequent verbal contact made. Routine safety checks conducted q15 minutes.   R: No adverse drug reactions noted. Pt verbally contracts for safety at this time. Pt complaint with medications and treatment plan. Pt interacts well with others on the unit. Pt remains safe at this time. Will continue to monitor.
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
## 2018-09-09 NOTE — Progress Notes (Signed)
[place] NOVEL CORONAVIRUS (COVID-19) DAILY CHECK-OFF SYMPTOMS - answer yes or no to each - every day NO YES  Have you had a fever in the past 24 hours?  . Fever (Temp > 37.80C / 100F) X   Have you had any of these symptoms in the past 24 hours? . New Cough .  Sore Throat  .  Shortness of Breath .  Difficulty Breathing .  Unexplained Body Aches   X   Have you had any one of these symptoms in the past 24 hours not related to allergies?   . Runny Nose .  Nasal Congestion .  Sneezing   X   If you have had runny nose, nasal congestion, sneezing in the past 24 hours, has it worsened?  X   EXPOSURES - check yes or no X   Have you traveled outside the state in the past 14 days?  X   Have you been in contact with someone with a confirmed diagnosis of COVID-19 or PUI in the past 14 days without wearing appropriate PPE?  X   Have you been living in the same home as a person with confirmed diagnosis of COVID-19 or a PUI (household contact)?    X   Have you been diagnosed with COVID-19?    X              What to do next: Answered NO to all: Answered YES to anything:   Proceed with unit schedule Follow the [facility] Inpatient Flowsheet.   
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
## 2018-09-10 MED ORDER — PRENATAL MULTIVITAMIN CH: 1.0000 | ORAL_TABLET | Freq: Every day | ORAL | 5 refills | Status: DC

## 2018-09-10 MED ORDER — TEMAZEPAM 15 MG PO CAPS: 15.0000 mg | ORAL_CAPSULE | Freq: Every day | ORAL | 0 refills | Status: DC

## 2018-09-10 MED ORDER — TEMAZEPAM 15 MG PO CAPS: 15.0000 mg | ORAL_CAPSULE | Freq: Every day | ORAL | 0 refills | Status: AC

## 2018-09-10 MED ORDER — OMEGA-3-ACID ETHYL ESTERS 1 G PO CAPS: 1.0000 g | ORAL_CAPSULE | Freq: Two times a day (BID) | ORAL | 5 refills | Status: DC

## 2018-09-10 MED ORDER — PRENATAL MULTIVITAMIN CH: 1.0000 | ORAL_TABLET | Freq: Every day | ORAL | 5 refills | Status: AC

## 2018-09-10 MED ORDER — OMEGA-3-ACID ETHYL ESTERS 1 G PO CAPS: 1.0000 g | ORAL_CAPSULE | Freq: Two times a day (BID) | ORAL | 5 refills | Status: AC

## 2018-09-10 NOTE — [facility] Suicide Risk Assessment (Signed)
[facility] INPATIENT:  Family/Significant Other Suicide Prevention Education  Suicide Prevention Education:  Patient Refusal for Family/Significant Other Suicide Prevention Education: The patient Breanna Sanchez has refused to provide written consent for family/significant other to be provided Family/Significant Other Suicide Prevention Education during admission and/or prior to discharge.  Physician notified.  Delphia Grates 09/10/2018, 9:50 AM

## 2018-09-10 NOTE — Plan of Care (Signed)
D: Patient is alert, oriented, pleasant, and cooperative. Denies SI, HI, AVH, and verbally contracts for safety. Patient reports she plans on not using any substances when she is discharged. Patient denies physical symptoms/pain.    A: Medications administered per MD order. Support provided. Patient educated on safety on the unit and medications. Routine safety checks every 15 minutes. Patient stated understanding to tell nurse about any new physical symptoms. Patient understands to tell staff of any needs.     R: No adverse drug reactions noted. Patient verbally contracts for safety. Patient remains safe at this time and will continue to monitor.   Problem: Education: Goal: Mental status will improve Outcome: Progressing   Problem: Safety: Goal: Periods of time without injury will increase Outcome: Progressing   Patient denies SI, HI, AVH, and contracts for safety. Patient remains safe and will continue to monitor.   [place] NOVEL CORONAVIRUS (COVID-19) DAILY CHECK-OFF SYMPTOMS - answer yes or no to each - every day NO YES  Have you had a fever in the past 24 hours?  Fever (Temp > 37.80C / 100F) X   Have you had any of these symptoms in the past 24 hours? New Cough  Sore Throat   Shortness of Breath  Difficulty Breathing  Unexplained Body Aches   X   Have you had any one of these symptoms in the past 24 hours not related to allergies?   Runny Nose  Nasal Congestion  Sneezing   X   If you have had runny nose, nasal congestion, sneezing in the past 24 hours, has it worsened?  X   EXPOSURES - check yes or no X   Have you traveled outside the state in the past 14 days?  X   Have you been in contact with someone with a confirmed diagnosis of COVID-19 or PUI in the past 14 days without wearing appropriate PPE?  X   Have you been living in the same home as a person with confirmed diagnosis of COVID-19 or a PUI (household contact)?    X   Have you been diagnosed with COVID-19?     X              What to do next: Answered NO to all: Answered YES to anything:   Proceed with unit schedule Follow the [facility] Inpatient Flowsheet.

## 2018-09-10 NOTE — [facility] Suicide Risk Assessment (Signed)
[facility] Discharge Suicide Risk Assessment   Principal Problem: <principal problem not specified> Discharge Diagnoses: Active Problems:   Psychoactive substance-induced psychosis (HCC)   Total Time spent with patient: 45 minutes  Musculoskeletal: Strength & Muscle Tone: within normal limits Gait & Station: normal Patient leans: N/A  Psychiatric Specialty Exam: ROS  Blood pressure 128/79, pulse (!) 103, temperature 98 F (36.7 C), temperature source Oral, resp. rate 16, height 5\' 5"  (1.651 m), weight 64 kg.Body mass index is 23.46 kg/m.  General Appearance: Casual  Eye Contact::  Good  Speech:  Clear and Coherent409  Volume:  Normal  Mood:  Euthymic  Affect:  Appropriate  Thought Process:  Coherent and Descriptions of Associations: Intact  Orientation:  Full (Time, Place, and Person)  Thought Content:  Logical  Suicidal Thoughts:  No  Homicidal Thoughts:  No  Memory:  Immediate;   Fair  Judgement:  Fair  Insight:  Fair  Psychomotor Activity:  Normal  Concentration:  Good  Recall:  Good  Fund of Knowledge:Good  Language: Good  Akathisia:  Negative  Handed:  Right  AIMS (if indicated):     Assets:  Communication Skills Desire for Improvement Housing Leisure Time Physical Health Resilience Social Support Transportation  Sleep:  Number of Hours: 6.75  Cognition: WNL  ADL's:  Intact   Mental Status Per Nursing Assessment::   On Admission:  NA  Demographic Factors:  NA  Loss Factors: NA  Historical Factors: NA  Risk Reduction Factors:   Sense of responsibility to family and Religious beliefs about death  Continued Clinical Symptoms:  Alcohol/Substance Abuse/Dependencies  Cognitive Features That Contribute To Risk:  None    Suicide Risk:  Minimal: No identifiable suicidal ideation.  Patients presenting with no risk factors but with morbid ruminations; may be classified as minimal risk based on the severity of the depressive symptoms    Plan Of  Care/Follow-up recommendations:  Activity:  full  Breanna Drudge, MD 09/10/2018, 9:18 AM

## 2018-09-10 NOTE — Progress Notes (Addendum)
Patient was discharged per order. This a.m., she had denied SI, HI, AVH, and med side effects. AVS, SRA, scripts and transition summary were all reviewed with patient. Pt was given an opportunity to ask questions and verbalized understanding of all discharge paperwork. Belongings were returned, and patient signed for receipt. Patient verbalized readiness for discharge and appeared in no acute distress when escorted to lobby where ride awaited.
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
## 2018-09-10 NOTE — Progress Notes (Signed)
  [facility] Adult Case Management Discharge Plan :  Will you be returning to the same living situation after discharge:  Yes,  home with mom At discharge, do you have transportation home?: Yes,  mom Do you have the ability to pay for your medications: Yes,  medicaid  Release of information consent forms completed and in the chart;  Patient's signature needed at discharge.  Patient to Follow up at: Follow-up Information    [facility] Follow up on 09/11/2018.   Why:  Hospital follow up appointment is Wednesday at 3:15p.  Please bring your current medications and discharge paperwork from this hospitalization.   Contact information: [address],  [place], [place] [address] Ph: [phone] Fx: [phone]          Next level of care provider has access to [facility] Link:no  Safety Planning and Suicide Prevention discussed: No.; pt denied spe; spe with pt.  Have you used any form of tobacco in the last 30 days? (Cigarettes, Smokeless Tobacco, Cigars, and/or Pipes): Yes  Has patient been referred to the Quitline?: Patient refused referral  Patient has been referred for addiction treatment: Yes  Delphia Grates, LCSW 09/10/2018, 10:03 AM

## 2020-07-23 ENCOUNTER — Encounter (HOSPITAL_COMMUNITY): Payer: Self-pay

## 2020-07-23 ENCOUNTER — Emergency Department (HOSPITAL_COMMUNITY)
Admission: EM | Admit: 2020-07-23 | Discharge: 2020-07-23 | Disposition: A | Discharge status: Home / Self Care | Payer: Medicaid Other | Attending: Emergency Medicine | Admitting: Emergency Medicine

## 2020-07-23 ENCOUNTER — Other Ambulatory Visit: Payer: Self-pay

## 2020-07-23 DIAGNOSIS — F1193 Opioid use, unspecified with withdrawal: Secondary | ICD-10-CM

## 2020-07-23 DIAGNOSIS — F1123 Opioid dependence with withdrawal: Secondary | ICD-10-CM | POA: Insufficient documentation

## 2020-07-23 DIAGNOSIS — F17210 Nicotine dependence, cigarettes, uncomplicated: Secondary | ICD-10-CM | POA: Diagnosis not present

## 2020-07-23 DIAGNOSIS — R110 Nausea: Secondary | ICD-10-CM | POA: Insufficient documentation

## 2020-07-23 DIAGNOSIS — R197 Diarrhea, unspecified: Secondary | ICD-10-CM | POA: Diagnosis not present

## 2020-07-23 DIAGNOSIS — R109 Unspecified abdominal pain: Secondary | ICD-10-CM | POA: Insufficient documentation

## 2020-07-23 LAB — COMPREHENSIVE METABOLIC PANEL
ALT: 23 U/L (ref 0–44)
AST: 20 U/L (ref 15–41)
Albumin: 4.5 g/dL (ref 3.5–5.0)
Alkaline Phosphatase: 71 U/L (ref 38–126)
Anion gap: 8 (ref 5–15)
BUN: 8 mg/dL (ref 6–20)
CO2: 25 mmol/L (ref 22–32)
Calcium: 9.4 mg/dL (ref 8.9–10.3)
Chloride: 105 mmol/L (ref 98–111)
Creatinine, Ser: 0.8 mg/dL (ref 0.44–1.00)
GFR, Estimated: >60 mL/min (ref >60)
Glucose, Bld: 99 mg/dL (ref 70–99)
Potassium: 3.5 mmol/L (ref 3.5–5.1)
Sodium: 138 mmol/L (ref 135–145)
Total Bilirubin: 0.9 mg/dL (ref 0.3–1.2)
Total Protein: 7.6 g/dL (ref 6.5–8.1)

## 2020-07-23 LAB — CBC
HCT: 43.7 % (ref 36.0–46.0)
Hemoglobin: 14.4 g/dL (ref 12.0–15.0)
MCH: 28 pg (ref 26.0–34.0)
MCHC: 33 g/dL (ref 30.0–36.0)
MCV: 84.9 fL (ref 80.0–100.0)
Platelets: 345 10*3/uL (ref 150–400)
RBC: 5.15 MIL/uL — ABNORMAL HIGH (ref 3.87–5.11)
RDW: 12.5 % (ref 11.5–15.5)
WBC: 11.1 10*3/uL — ABNORMAL HIGH (ref 4.0–10.5)
nRBC: 0 % (ref 0.0–0.2)

## 2020-07-23 LAB — I-STAT BETA HCG BLOOD, ED (MC, WL, AP ONLY): I-stat hCG, quantitative: <5 m[IU]/mL (ref <5)

## 2020-07-23 LAB — SALICYLATE LEVEL: Salicylate Lvl: <7 mg/dL — ABNORMAL LOW (ref 7.0–30.0)

## 2020-07-23 LAB — ETHANOL: Alcohol, Ethyl (B): <10 mg/dL (ref <10)

## 2020-07-23 LAB — ACETAMINOPHEN LEVEL: Acetaminophen (Tylenol), Serum: <10 ug/mL — ABNORMAL LOW (ref 10–30)

## 2020-07-23 MED ORDER — ONDANSETRON 4 MG PO TBDP
4.0000 mg | ORAL_TABLET | Freq: Once | ORAL | Status: AC
  Administered 2020-07-23: 4 mg via ORAL
  Filled 2020-07-23: qty 1

## 2020-07-23 MED ORDER — HYOSCYAMINE SULFATE 0.125 MG SL SUBL
0.1250 mg | SUBLINGUAL_TABLET | Freq: Once | SUBLINGUAL | Status: AC
  Administered 2020-07-23: 0.125 mg via ORAL
  Filled 2020-07-23: qty 1

## 2020-07-23 MED ORDER — ONDANSETRON 4 MG PO TBDP: 4.0000 mg | ORAL_TABLET | Freq: Three times a day (TID) | ORAL | 0 refills | Status: AC | PRN

## 2020-07-23 NOTE — ED Notes (Signed)
Tech placed pt on monitor. Pt ripped off wiring for monitor.

## 2020-07-23 NOTE — ED Notes (Signed)
Mother Bernadette Hoit [phone] DEMANDS we detox and help her daughter, she's on her way
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
## 2020-07-23 NOTE — ED Notes (Signed)
Pt wants to leave, Dr. Eudelia Bunch made aware.

## 2020-07-23 NOTE — ED Triage Notes (Addendum)
Pt reports heroin withdrawal last use was yesterday and pt states she took an suboxon today and states she wants to get clean and reports took 150mg  of trazodone for sleep today
